# Patient Record
Sex: Male | Born: 1995 | Race: White | Hispanic: No | Marital: Single
Health system: Midwestern US, Academic
[De-identification: ages and names within clinical notes are randomized; demographics above are authoritative.]

---

## 2016-02-17 IMAGING — CT Abdomen^1_ABD_PELVIS_WO_W (Adult)
1 of 2 series · 13 of 32 positions shown, 19 images · IV contrast (APPLIED)
Comparison: none

[Series 2: abd/pel w/o 5.0 soft tissue · axial · non-contrast · 0.71mm/px · z∈[-421,-26]mm · 13 of 91 slices shown, 19 images]
[im 7/91  soft-tissue]
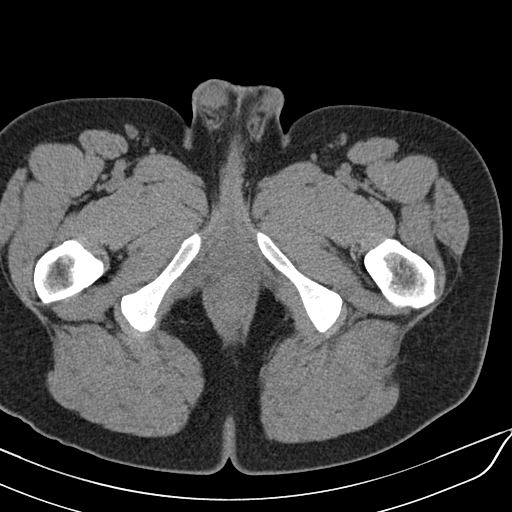
[im 7/91  bone]
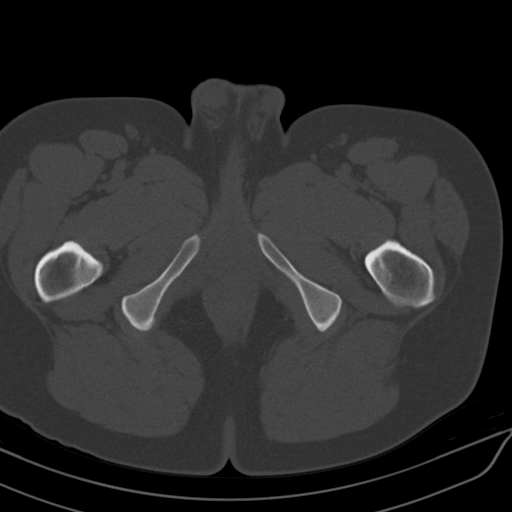
[im 13/91  soft-tissue]
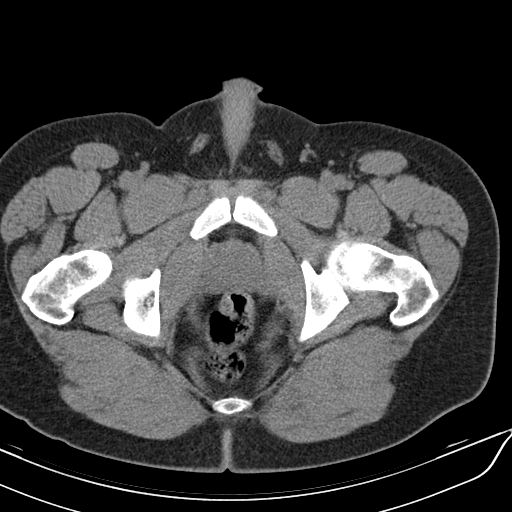
[im 19/91  soft-tissue]
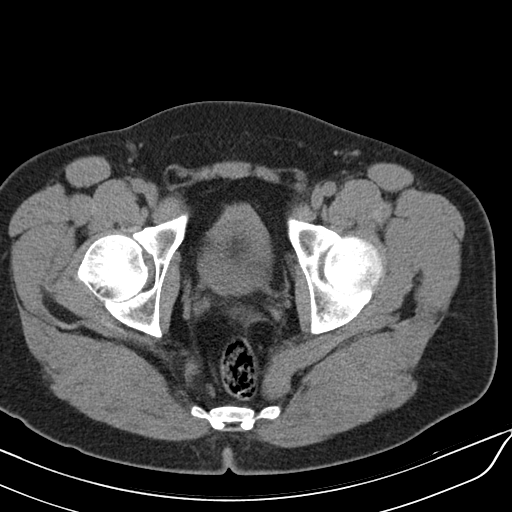
[im 25/91  soft-tissue]
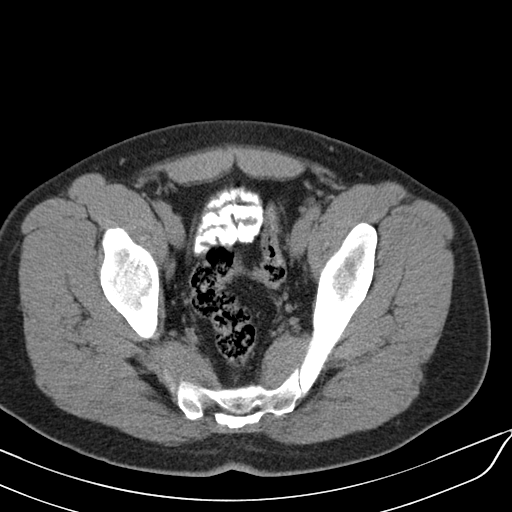
[im 31/91  soft-tissue]
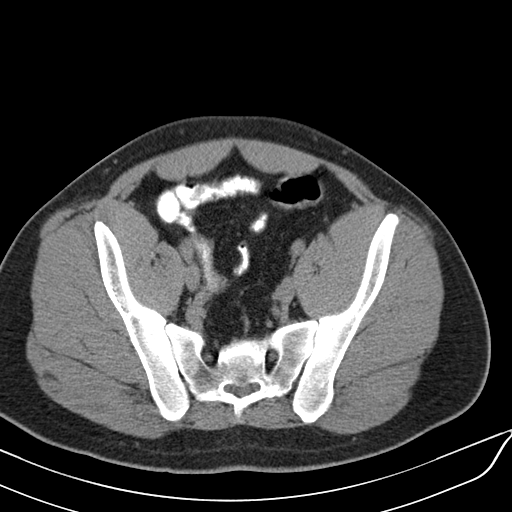
[im 37/91  soft-tissue]
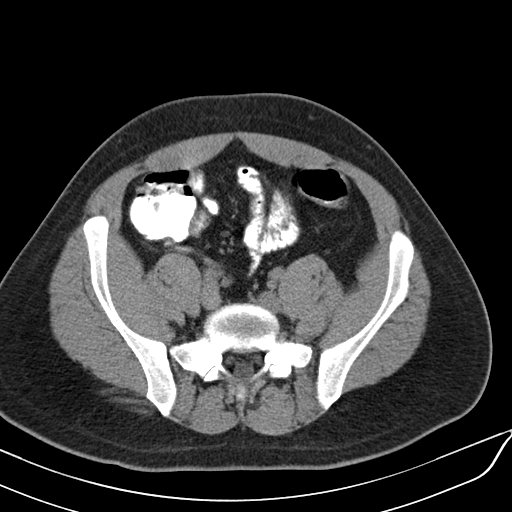
[im 49/91  soft-tissue]
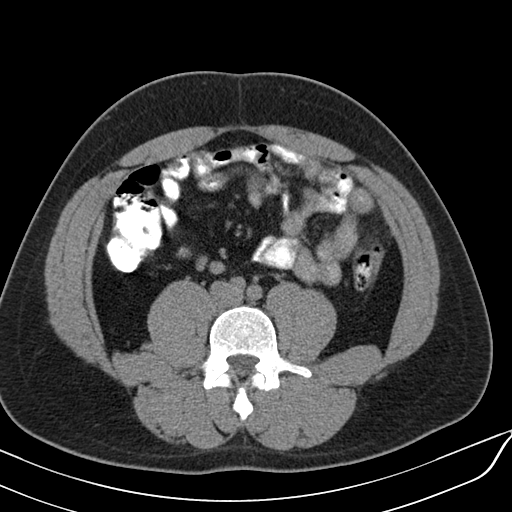
[im 55/91  soft-tissue]
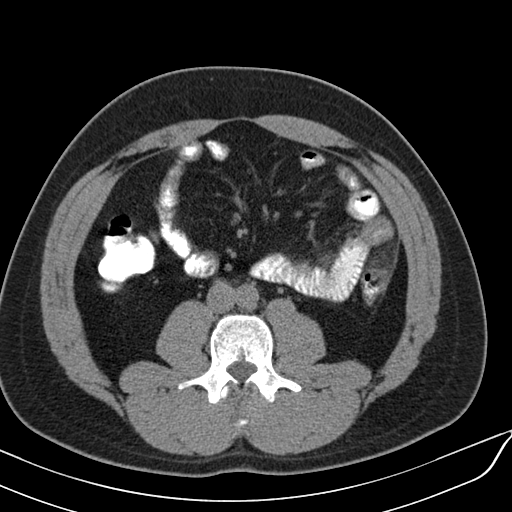
[im 61/91  soft-tissue]
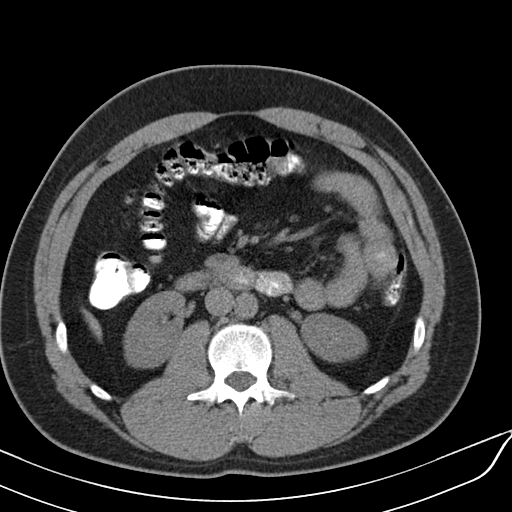
[im 61/91  bone]
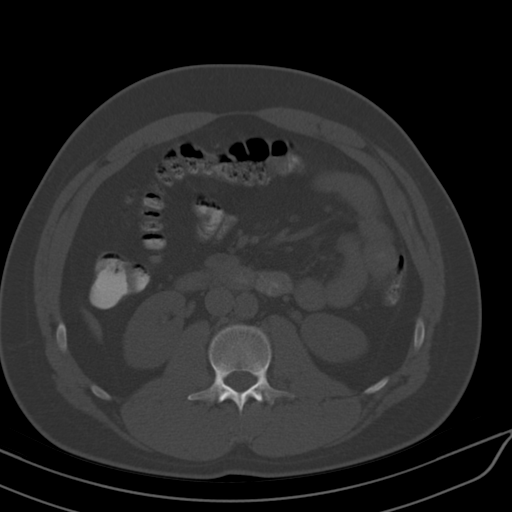
[im 67/91  soft-tissue]
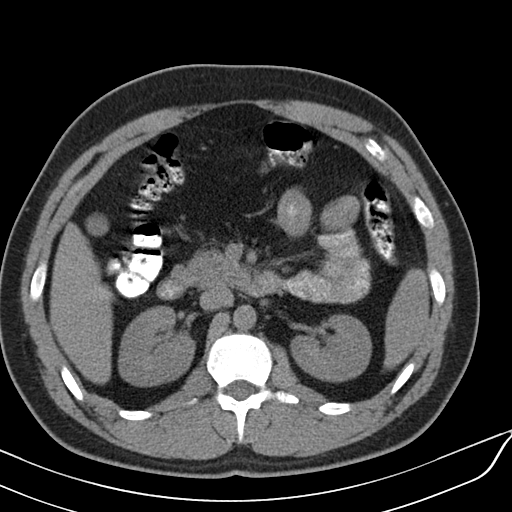
[im 67/91  lung]
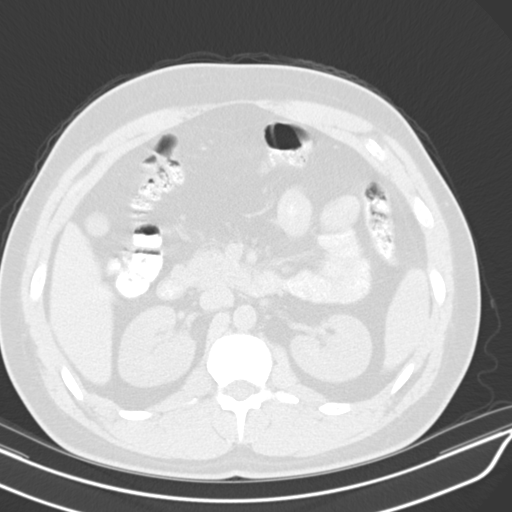
[im 73/91  soft-tissue]
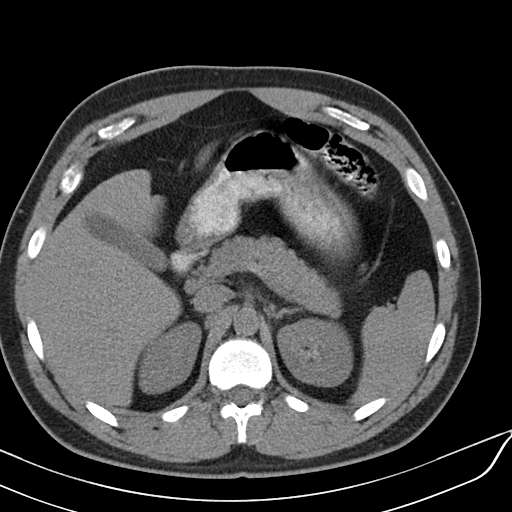
[im 73/91  lung]
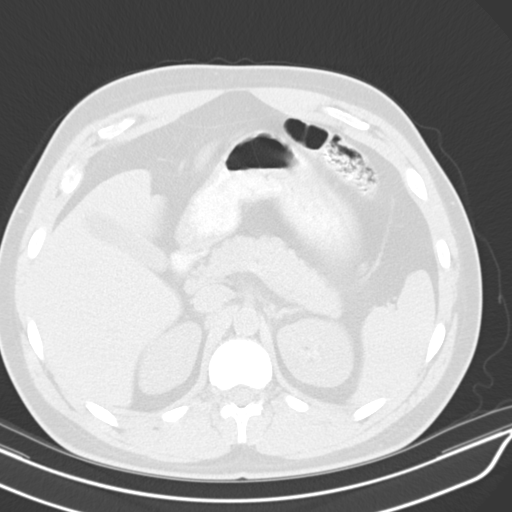
[im 79/91  soft-tissue]
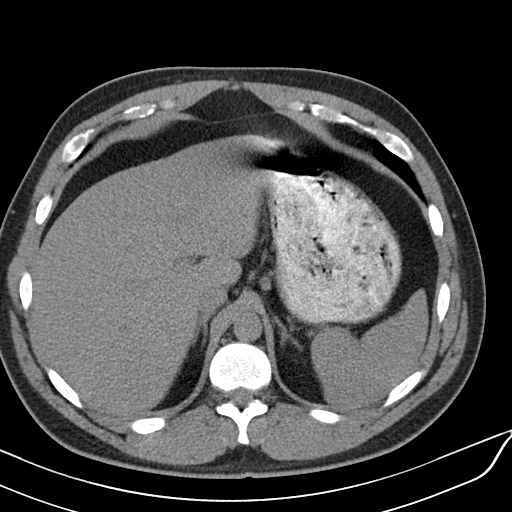
[im 79/91  lung]
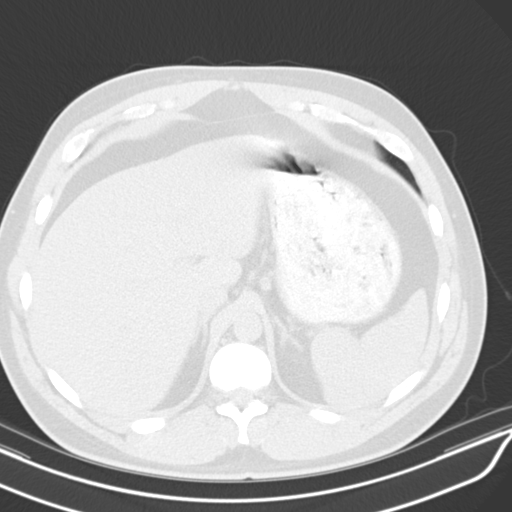
[im 85/91  soft-tissue]
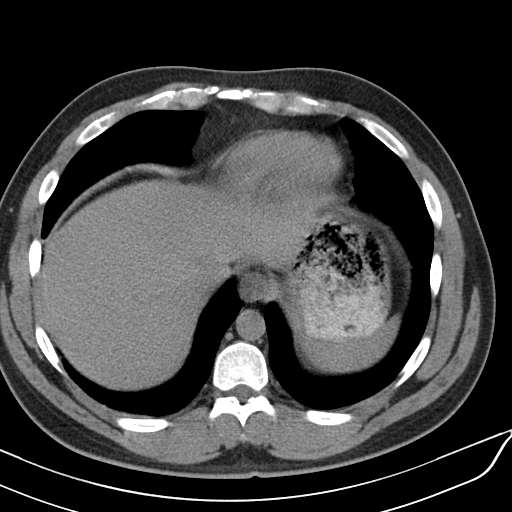
[im 85/91  lung]
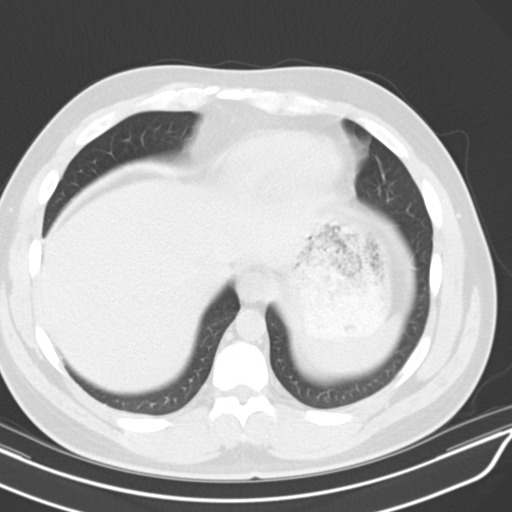

[13 of 32 positions shown; findings below may reference images not displayed]

DIAGNOSTIC STUDIES

EXAM
CT abdomen and pelvis with and without contrast

INDICATION
left abd pain and palpable mass
ABDOMINAL PAIN LEFT SIDE ABOVE ILIAC CREST X 3 DAYS. CHANGE IN BM'S.
ELEVATED WHITE COUNT. GIVEN 100 ML OMNIPAQUE 300 IV AND 50 ML OMNI ORALLY.
LM

TECHNIQUE
Volumetric multi detector CT images of the abdomen and pelvis were obtained before and after the
administration of IV contrast.

COMPARISONS
None available]

FINDINGS
The visualized lung bases are clear. There is focal segmental thickening and pericolonic
inflammatory changes of the mid descending colon. There is no evidence of hydronephrosis,
hydroureter or radiopaque calculus. There is no free air or free fluid. There is no mesenteric or
pelvic adenopathy. The appendix is normal in the right lower quadrant. The remaining hollow and
solid abdominal and pelvic viscera are grossly unremarkable. There is no acute osseous abnormality.
There is no significant degenerative change of the lumbosacral spine.

IMPRESSION
1. Focal thickening and pericolonic inflammatory changing of the mid descending colon likely
representing low-grade colitis versus diverticulitis.

## 2017-08-31 ENCOUNTER — Encounter: Admit: 2017-08-31 | Discharge: 2017-08-31 | Payer: Commercial Managed Care - PPO

## 2017-10-20 ENCOUNTER — Encounter: Admit: 2017-10-20 | Discharge: 2017-10-20 | Payer: Commercial Managed Care - PPO

## 2017-10-27 ENCOUNTER — Encounter: Admit: 2017-10-27 | Discharge: 2017-10-27 | Payer: Commercial Managed Care - PPO

## 2017-10-27 DIAGNOSIS — B360 Pityriasis versicolor: ICD-10-CM

## 2017-10-27 DIAGNOSIS — B029 Zoster without complications: ICD-10-CM

## 2017-10-27 DIAGNOSIS — R509 Fever, unspecified: Principal | ICD-10-CM

## 2017-10-28 ENCOUNTER — Ambulatory Visit: Admit: 2017-10-28 | Discharge: 2017-10-28 | Payer: Commercial Managed Care - PPO

## 2017-10-28 ENCOUNTER — Encounter: Admit: 2017-10-28 | Discharge: 2017-10-28 | Payer: Commercial Managed Care - PPO

## 2017-10-28 ENCOUNTER — Ambulatory Visit: Admit: 2017-10-28 | Discharge: 2017-10-29 | Payer: Commercial Managed Care - PPO

## 2017-10-28 ENCOUNTER — Ambulatory Visit: Admit: 2017-10-28 | Discharge: 2017-10-28 | Payer: No Typology Code available for payment source

## 2017-10-28 DIAGNOSIS — R509 Fever, unspecified: Principal | ICD-10-CM

## 2017-10-28 DIAGNOSIS — M533 Sacrococcygeal disorders, not elsewhere classified: ICD-10-CM

## 2017-10-28 DIAGNOSIS — R768 Other specified abnormal immunological findings in serum: Secondary | ICD-10-CM

## 2017-10-28 DIAGNOSIS — M5489 Other dorsalgia: Principal | ICD-10-CM

## 2017-10-28 DIAGNOSIS — M2550 Pain in unspecified joint: ICD-10-CM

## 2017-10-28 DIAGNOSIS — B029 Zoster without complications: ICD-10-CM

## 2017-10-28 DIAGNOSIS — B360 Pityriasis versicolor: ICD-10-CM

## 2017-10-28 DIAGNOSIS — R5382 Chronic fatigue, unspecified: ICD-10-CM

## 2017-10-28 LAB — HEPATITIS B SURFACE AB: Lab: >10 m[IU]/mL (ref >60)

## 2017-10-28 LAB — RHEUMATOID FACTOR (RF): Lab: <20 [IU]/mL (ref <24)

## 2017-10-28 LAB — PROTEIN/CR RATIO,UR RAN
Lab: 10 mg/dL — AB (ref 25–110)
Lab: 195 mg/dL (ref 7–40)

## 2017-10-28 LAB — TSH WITH FREE T4 REFLEX: Lab: 2.1 uU/mL — ABNORMAL LOW (ref 0.35–5.00)

## 2017-10-28 LAB — URIC ACID: Lab: 6.7 mg/dL (ref 4.0–8.0)

## 2017-10-28 LAB — COMPREHENSIVE METABOLIC PANEL
Lab: 139 MMOL/L (ref 137–147)
Lab: 3.9 MMOL/L (ref 3.5–5.1)
Lab: 83 mg/dL (ref 70–100)

## 2017-10-28 LAB — HEPATITIS C ANTIBODY W REFLEX HCV PCR QUANT: Lab: NEGATIVE 10*3/uL (ref 0–0.45)

## 2017-10-28 LAB — SED RATE: Lab: 3 mm/h (ref 0–15)

## 2017-10-28 LAB — URINALYSIS MICROSCOPIC REFLEX TO CULTURE

## 2017-10-28 LAB — URINALYSIS DIPSTICK REFLEX TO CULTURE: Lab: NEGATIVE mL/min (ref 0–0.80)

## 2017-10-28 LAB — IRON + BINDING CAPACITY + %SAT+ FERRITIN
Lab: 104 ng/mL (ref 30–300)
Lab: 462 ug/dL — ABNORMAL HIGH (ref 270–380)
Lab: 75 ug/dL (ref 50–185)

## 2017-10-28 LAB — CBC AND DIFF
Lab: 0 10*3/uL (ref 0–0.20)
Lab: 5.2 M/UL (ref 4.4–5.5)
Lab: 8.9 10*3/uL (ref 4.5–11.0)

## 2017-10-28 LAB — HEPATITIS B CORE AB TOT (IGG+IGM): Lab: NEGATIVE U/L (ref 7–56)

## 2017-10-28 LAB — CREATINE KINASE-CPK: Lab: 76 U/L (ref 35–232)

## 2017-10-28 LAB — C REACTIVE PROTEIN (CRP): Lab: 0.2 mg/dL (ref <1.0)

## 2017-10-28 LAB — HEPATITIS B SURFACE AG: Lab: NEGATIVE U/L (ref 7–40)

## 2017-10-28 NOTE — Progress Notes
Date of Service: 10/28/2017    Subjective:             Mark Perez is a 21 y.o. male referred by [facility] for elevated ANA    History of Present Illness    In 01/2016 he had chest pain, didn't feel right, went to ER in [place] and sent him home. Then he had a fever for 5 week and really high for a couple weeks. Saw ID here and did blood work and never really came up with anything. Was going to do scans but felt better by then.  The fevers were constant. They were worse right before bed. The highest it got was 102/103 in the evenings.     In 01/2017 he started running fever again, saw PCP and found a positive RNP. Saw rheumatologist Dr. Carney Bern and put him on diclofenac which helped significantly. Anytime he drank alcohol he hurt really bad. In 02/2017 he had really bad sore throat and fever, swollen glands, strep test came back positive and treated and symptoms resolved. This episode lasted three weeks. He had been put on prednisone which helped too. Joints hurt during these episodes.     He never returned to normal after the first episode. He has had ongoing fatigue, joint pain, not feeling good. Prior to 01/2016 he was healthy without issues, working hard and long hours without history of joint pain.     He has pain in his back and chest and rib cage, shoulders and neck. Every now and then his ankles are stiff. If he doesn't take the diclofenac it hurts bad and help tremendously.     His whole back hurts in entire spine. No radiation into buttocks. There are times his hips hurt and sits too long, the side of the hips hurt. It is overall better with movement and worse with inactivity and would probably get better with exercise. He feels stiff in the morning for about 30 minutes but takes diclofenac in the morning after waking up, but was much longer prior to starting diclofenac. Mostly in the back and some in the hips. Back pain would wake him up at night before starting diclofenac. Always having to stretch. Has neck pain and stiffness and in the muscles, never had it before this started. No radiculopathy.     Has pain in the knees when bending down too much, pop, no swelling. If he squats for long period his knees hurt in the front and side. No swelling, redness or warmth in the knees.     The ankles are stiff in the morning but not as bad as the back. No swelling, warmth or redness. This improved with diclofeanc too. Mostly on the side and front of the ankle, never had achilles tendon or heel pain. No plantar fasciitis that he thinks but not sure. Never had dactylitis.     Not much pain in the elbows, maybe a little, maybe if overused. He does do repetitive actions. No swelling in the elbows.     The shoulders hurt in the morning, feel stiff. Hard to raise them up. Better with movement. They were worse before diclofenac.     Notices some pain in the hands with weather changes and in the cold. Sort of stiff in the cold in the MCPs. If he uses his hands a lot they hurt. Never had stiffness in hadns in the morning prior to diclofenac. None in the PIPs or DIPs. Not noticed anything in the  wrists. No swelling in the hand joints. Never really that bad or daily prior to diclofenac.    He is still fatigued. Used to be very active and worked long hard hours prior to this starting. Never stopped, per mom. Sleep 8 hours and better now since starting diclofenac, but sometimes he feel unrefreshed. Mom thinks he snores but no apnea. No recent weight gain. No weight loss. Appetite is good even before diclofenac. Fatigue helped a little after starting diclofenac. Not falling asleep during day. Used to work past 5pm but not anymore.     No hair loss. No photosensitivity. No dry eyes or dry mouth. No parotid gland swelling. No inflammatory eye disease. No sores in mouth or nose, or genital area. No STD. No blood in urine, UTIs or GI infection.     Gets HA every once in a while, but not common. In 2016, before his first fever, he had stomach pain on the side with abnormal stools, no blood. Did a CT he had diverticulitis of ascending colon and put on antibiotics and got better. No more abdominal pain, diarrhea, blood in stool. Maybe has a hemorrhoid right now with blood on toilet paper one. No black stool. No GERD unless he drinks soda.     No CP, SOB, Cough. No palpitations, lightheadedness, dizzyness, or passing out. No rashes. No kidney stones, no DVT/PE/CVA/Seizure.    No raynaud's. No numbness or tingling.     Past medical history:  Hypertension     Past surgical history:  None     Family History:  HTN in father, brother, uncles  Father has had long standing pain, but never told of AS   None for IBD, psoriasis  Uncle and Paternal GF with lymphoma     Social history:  Drinks alcohol once a month   No tobacco use   No illicit drug use  He works on a farm, farm hand  Lives in [place]    Review of outside records:  03/10/17: HLA-B27 negative, CCP IgG/IgA 14 (0-19 units), anti-double-stranded DNA antibody 1IU per mL, anti-RNP 2.3 AI (0???0 0.9), Smith antibody less than 0.2 AI, anti-SCL 70 less than 0.2 AI, SSA antibody less than 0.2, SSB antibody less than 0.2, anti-Jo 1 less than 0.2, anticentromere less than 0.2    03/10/16: WBC 13.2, ALC 8700 high, AMC 1100 high, ESR 17 high, CRP 1.96 high, AST 99 high, a LT 185 high, alk phos 106 normal, albumin 3.4 low rest of CMP WNL, rheumatoid factor 49 high (<24), ANA negative, blood cultures x2 negative x 5 days, HIV negative  03/24/16: ALC 4900, rest of CBC with differential WNL, a LT 60 high, AST and alkaline phosphatase normal, albumin 4.5, bilirubin 0.7 normal    03/10/16:  Final Diagnosis:   Peripheral blood, flow cytometry:   Phenotypic study reveals an increase in CD8+ T cells, with a mildly   atypical phenotype.     Interpretation:   Lymphocytes comprise 69% of total events. ???The majority are T cells with a decreased CD4:CD8 ratio of 0.22, with an increase in CD8+ T cells, with a   mildly dim CD7 and CD5, and no expression of NK/LGL markers. The cells   express alpha/beta receptor. The B cells are few in number and are   polyclonal with normal antigen expression.     The significance of the CD8+ T cell population is uncertain, and these   cells may represent a reactive population, however a neoplastic process  cannot be entirely excluded. Recommend correlation with the morphologic   findings and clinical correlation. It may be appropriate to monitor the   patient for resolution of the findings. If there is a strong suspicion for   a neoplastic process, T cell gene rearrangement study could be considered.     Myeloblasts are not increased, and comprise 0.04% of total cells. ???     Rheumatology note [facility]. Geoffery Spruce [facility] 04/12/17:   Chronic joint pain.  He has history of 1 year and 3 months of low back, upper back and shoulder aches and pains.  He has had a couple episodes of febrile illness but fairly constant.  It makes me wonder about spondylitis, HLA-B27 is negative.  I believe ANA was negative.  Rheumatoid factor is negative.  Try Voltaren 75 mg twice a day and sort of an undifferentiated spondylitis-like approach.       Review of Systems  Constitutional: Positive for diaphoresis, fatigue and fever.   Cardiovascular:        HIGH B/P   Musculoskeletal: Positive for arthralgias and myalgias.   Neurological: Positive for weakness.   All other systems reviewed and are negative.      Objective:         ??? diclofenac sodium DR (VOLTAREN) 75 mg tablet Take 75 mg by mouth twice daily. Take with food.     Vitals:    10/28/17 0856   BP: 150/88   Pulse: 73   Resp: 18   Temp: 36.6 ???C (97.8 ???F)   TempSrc: [place]   SpO2: 99%   Weight: 96.6 kg (213 lb)   Height: 170.2 cm (67)     Body mass index is 33.36 kg/m???. Discussed patient's BMI with him.  The body mass index is 33.36 kg/m???. and falls within the category of Obesity 1 (30  to <35); specialist visit only, referred back to Primary Care Provider for follow up.      Physical Exam  Constitutional: A&Ox3, NAD  HEENT: at/nc, EOMI, sclera normal, MMM, no oral lesions, no parotid gland swelling or tenderness    Neck: no LAD   Cardiac: RRR, no murmurs noted   Pulm: CTAB, no wheeze, rales or rhonchi   Abd: soft, non tender, non distended, +BS  Neuro: No focal deficits, motor and sensory intact, MS 5/5 b/l UE and LE, b/l grip 5/5   Ext: no edema  Skin: no dermatitis, no sclerodactyly, no skin tightening or thickening, no telangectasia, no malar erythema, no digital pitting of ulcers, no pitting nails or psoriasis, no dactylitis    MSK:   Schober test 15 cm  Occiput to wall test normal  Tender to palpation in bilateral SI joints  TTP paraspinal lumbar, thoracic and cervical muscles  TTP cervical, thoracic and lumbar spine   Reduced range of motion in cervical spine actively  Pain in right shoulder with empty can test  FABER test negative   No synovitis        Assessment:  1. Inflammatory back pain - improved with NSAIDs  2. Previously Elevated Rheumatoid Factor   3. History of recurrent fever, leukocytosis and lymphocytosis in 2017  4. Abnormal flow cytometry 2017  5. Elevated inflammatory markers 2017  6. Hypertension  7. Fatigue- improved with NSAIDs, but not resolved  8.  Diverticulitis 2016, resolved after antibiotics    Mark Perez is a pleasant 21 year old Caucasian male referred for possible ankylosing spondylitis.  In 2017 he developed persistent  fevers, sore throat, swollen glands, joint pains, fatigue, found to have leukocytosis, elevated rheumatoid factor, elevated ESR and CRP without a clear etiology.  He responded to prednisone at that time.  He had another episode in 01/2017.  He has not had another episode since then.      However since his first episode in 2017, he has not been the same since (previously very healthy, worked long hours on the farm) and has been having daily persistent back pain which is inflammatory in nature and responded well to daily scheduled NSAIDs.  He also had fatigue that has responded to NSAIDs, but not completely resolved.    In 2016, before his first fever episode, he had stomach pain on the side with abnormal stools, no blood. Did a CT he had diverticulitis of ascending colon and put on antibiotics and got better. Report not available. He denies a history of STDs.     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  I would like to obtain a dedicated x-rays of the SI joints as well as the thoracic and lumbar spine given symptoms and these areas.  If SI joint x-rays are normal, we may need to consider obtaining an MRI with STIR technique to further evaluate for signs of active sacroiliitis that has not yet shown up on x-ray and possible non-radiographic axial spondylarthritis.  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      I would also like to obtain records from previous CT abdomen pelvis, there could be an underlying reactive arthritis with history of GI infection in 2016.     Plan:  1.  Repeat rheumatoid factor, if returns negative, previous rheumatoid factor could have been from transient infection  2.  Check CCP antibody.   3.  CBC with differential, CMP, ESR, CRP, UA, protein to creatinine ratio, uric acid, CPK, SPEP with immunofixation, iron panel, hepatitis B and C  4.  If iron panel shows iron deficiency, consider IBD panel and celiac panel  5.  SI joint x-rays to evaluate for sacroiliitis  6.  Thoracic and lumbar spine x-ray given symptoms in these areas and evaluation for any ankylosing spondylitis changes (i.e. Syndesmophytes)  7.  Pending x-ray results, consider MRI with STIR technique to evaluate for non-radiographic axial spondylarthritis  8.  Request previous CT abdomen pelvis report  9.  Continue diclofenac 75 mg twice a day given overall improvement in back symptoms    Once results return, we will arrange for follow-up accordingly    Costella Hatcher DO  Rheumatology Staff    Orders Placed This Encounter   ??? SI JOINTS MIN 3 VIEWS   ??? L SPINE COMPLETE   ??? T SPINE AP & LAT 2 VIEWS   ??? C REACTIVE PROTEIN (CRP) today   ??? CBC AND DIFF  today   ??? COMPREHENSIVE METABOLIC PANEL  today   ??? SED RATE today   ??? URINALYSIS DIPSTICK REFLEX TO CULTURE   ??? URINALYSIS MICROSCOPIC REFLEX TO CULTURE   ??? UA REFLEX CULTURE LABEL   ??? URINE-SPOT PROT/Cr RATIO today   ??? IMMUNOFIXATION, SERUM (IFES)   ??? CCP IGG ANTIBODY today   ??? RHEUMATOID FACTOR (RF)  today   ??? URIC ACID, today   ???  CREATINE KINASE-CPK [today   ??? ELECTROPHORESIS-SERUM PROTEIN today   ??? IRON + BINDING CAPACITY + %SAT + FERRITIN today   ??? TSH WITH FREE T4 REFLEX today   ??? HEPATITIS B SURFACE AG today   ??? HEPATITIS B CORE AB TOT (IGG+IGM) today   ??? HEPATITIS B SURFACE AB today   ??? HEPATITIS C AB today

## 2017-10-29 LAB — ELECTROPHORESIS-SERUM PROTEIN
Lab: 63 % (ref 48–68)
Lab: 7.9 g/dL (ref 6.0–8.0)
Lab: 8.5 % (ref 5–15)
Lab: 9.5 % (ref 9–17)

## 2017-10-29 LAB — IMMUNOFIXATION, SERUM (IFES)

## 2017-10-29 LAB — CCP IGG ANTIBODY

## 2017-11-04 ENCOUNTER — Encounter: Admit: 2017-11-04 | Discharge: 2017-11-04 | Payer: Commercial Managed Care - PPO

## 2017-11-04 DIAGNOSIS — R69 Illness, unspecified: Principal | ICD-10-CM

## 2017-11-06 ENCOUNTER — Encounter: Admit: 2017-11-06 | Discharge: 2017-11-06 | Payer: Commercial Managed Care - PPO

## 2017-11-06 DIAGNOSIS — K921 Melena: ICD-10-CM

## 2017-11-06 DIAGNOSIS — M47819 Spondylosis without myelopathy or radiculopathy, site unspecified: Principal | ICD-10-CM

## 2017-11-06 DIAGNOSIS — E611 Iron deficiency: ICD-10-CM

## 2017-11-20 ENCOUNTER — Ambulatory Visit: Admit: 2017-11-20 | Discharge: 2017-11-20 | Payer: Commercial Managed Care - PPO

## 2017-11-20 DIAGNOSIS — M47819 Spondylosis without myelopathy or radiculopathy, site unspecified: Principal | ICD-10-CM

## 2017-11-20 DIAGNOSIS — E611 Iron deficiency: ICD-10-CM

## 2017-11-20 DIAGNOSIS — K921 Melena: Secondary | ICD-10-CM

## 2017-11-22 LAB — CELIAC SCREEN: Lab: 100 U/mL — ABNORMAL HIGH (ref <15)

## 2017-11-25 LAB — INFLAMMATORY BOWEL DISEASE PANEL
Lab: 21 — ABNORMAL HIGH
Lab: <10

## 2017-11-26 ENCOUNTER — Encounter: Admit: 2017-11-26 | Discharge: 2017-11-26 | Payer: Commercial Managed Care - PPO

## 2017-11-26 NOTE — Telephone Encounter
-----  Message from Hildred Priest, DO sent at 11/06/2017  3:10 PM CST -----  Please call patient to inform of the following:    1.  The rheumatoid factor test that was elevated 1 year ago is now negative.  I suspect that this was elevated in the past due to an infection.  2.  Inflammation marker ESR and CRP are both normal.  These were both elevated one year ago when he was last seen with infectious disease.  3.  Iron studies show mildly low iron levels.  This is unusual for a young male, so I would like him to have a few additional labs done.  4.  All the other labs are negative and, or normal  5.  I recommend he continue taking diclofenac 75 mg twice a day  6.  Please help him schedule follow up with me 1/31 at 4pm. Please ask him to reschedule this appt if for some reason the MRI with STIR technique is not done prior to this appt.

## 2017-11-26 NOTE — Telephone Encounter
Called pt and lvm for a return call. Was going to offer pt appt on Monday 11/29/17. Will await pt to return call. Pt had MRI done on 11/20/17.

## 2017-11-26 NOTE — Telephone Encounter
-----   Message from Delano MetzPooja G Bhadbhade, DO sent at 11/06/2017  3:01 PM CST -----  Please call patient to inform of the following:    1.  X-ray of his thoracic and lumbar spine normal  2.  X-ray of his sacroiliac joints are normal  3.  Given his history and positive response to diclofenac tablets, I am still suspicious for seronegative spondyloarthropathy which can sometimes show normal x-rays early on in the disease, but can show abnormalities on MRI which is more sensitive to early changes, therefore I recommend an MRI with STIR technique of his sacroiliac joints to evaluate to help establish a diagnosis. I have ordered this, please let him know.    3.  Please see result note for labs

## 2017-11-29 NOTE — Telephone Encounter
Pt returned call and was notified of results and recommendations below. Pt stated that he labs and MRI completed and saw on Mychart that the labs came back abnormal. Pt stated he would like to come in for a follow up to discuss everything. Scheduling pt on 12/07/17 at 300pm with Dr. Fannie KneeBhadbhade. Pt aware of time date and location.    Routing to Dr. Fannie KneeBhadbhade as Lorain ChildesFYI. Pt stated he would wait to talk about results at appt. Pt stated he did try to be gluten free the last 4 to 5 days and has noticed a significant difference in how he feels. Pt stated it will be hard to go back on gluten with considering how he feels. Pt notified to wait to speak with Dr. Fannie KneeBhadbhade regarding the results and we may refer him to a GI specialist. Pt stated understanding and had no further questions at this time.

## 2017-12-07 ENCOUNTER — Ambulatory Visit: Admit: 2017-12-07 | Discharge: 2017-12-07 | Payer: Commercial Managed Care - PPO

## 2017-12-07 ENCOUNTER — Encounter: Admit: 2017-12-07 | Discharge: 2017-12-07 | Payer: Commercial Managed Care - PPO

## 2017-12-07 ENCOUNTER — Ambulatory Visit: Admit: 2017-12-07 | Discharge: 2017-12-07

## 2017-12-07 DIAGNOSIS — R5382 Chronic fatigue, unspecified: ICD-10-CM

## 2017-12-07 DIAGNOSIS — R509 Fever, unspecified: Principal | ICD-10-CM

## 2017-12-07 DIAGNOSIS — E611 Iron deficiency: ICD-10-CM

## 2017-12-07 DIAGNOSIS — M5489 Other dorsalgia: ICD-10-CM

## 2017-12-07 DIAGNOSIS — K900 Celiac disease: Principal | ICD-10-CM

## 2017-12-07 DIAGNOSIS — B029 Zoster without complications: ICD-10-CM

## 2017-12-07 DIAGNOSIS — B360 Pityriasis versicolor: ICD-10-CM

## 2017-12-07 DIAGNOSIS — K5900 Constipation, unspecified: ICD-10-CM

## 2017-12-08 LAB — 25-OH VITAMIN D (D2 + D3): Lab: 28 ng/mL — ABNORMAL LOW (ref 30–80)

## 2017-12-12 ENCOUNTER — Encounter: Admit: 2017-12-12 | Discharge: 2017-12-12 | Payer: Commercial Managed Care - PPO

## 2017-12-12 MED ORDER — ERGOCALCIFEROL (VITAMIN D2) 50,000 UNIT PO CAP
1 | ORAL_CAPSULE | ORAL | 0 refills | 56.00000 days | Status: AC
Start: 2017-12-12 — End: ?

## 2018-02-18 ENCOUNTER — Ambulatory Visit: Admit: 2018-02-18 | Discharge: 2018-02-18 | Payer: Commercial Managed Care - PPO

## 2018-02-18 ENCOUNTER — Encounter: Admit: 2018-02-18 | Discharge: 2018-02-18 | Payer: Commercial Managed Care - PPO

## 2018-02-18 DIAGNOSIS — B029 Zoster without complications: ICD-10-CM

## 2018-02-18 DIAGNOSIS — R509 Fever, unspecified: Principal | ICD-10-CM

## 2018-02-18 DIAGNOSIS — R140 Abdominal distension (gaseous): ICD-10-CM

## 2018-02-18 DIAGNOSIS — B360 Pityriasis versicolor: ICD-10-CM

## 2018-02-18 DIAGNOSIS — M549 Dorsalgia, unspecified: ICD-10-CM

## 2018-02-18 DIAGNOSIS — R109 Unspecified abdominal pain: Principal | ICD-10-CM

## 2018-02-18 DIAGNOSIS — I10 Essential (primary) hypertension: ICD-10-CM

## 2018-03-16 ENCOUNTER — Ambulatory Visit: Admit: 2018-03-16 | Discharge: 2018-03-16 | Payer: Commercial Managed Care - PPO

## 2018-03-16 ENCOUNTER — Encounter: Admit: 2018-03-16 | Discharge: 2018-03-16 | Payer: Commercial Managed Care - PPO

## 2018-03-16 DIAGNOSIS — R140 Abdominal distension (gaseous): ICD-10-CM

## 2018-03-16 DIAGNOSIS — R109 Unspecified abdominal pain: Principal | ICD-10-CM

## 2018-03-19 IMAGING — US DUPRENAL
1 series · 13 of 25 positions shown · non-contrast
Comparison: none

[Series 1: us renal artery · 13 of 79 slices shown]
[im 1/79]
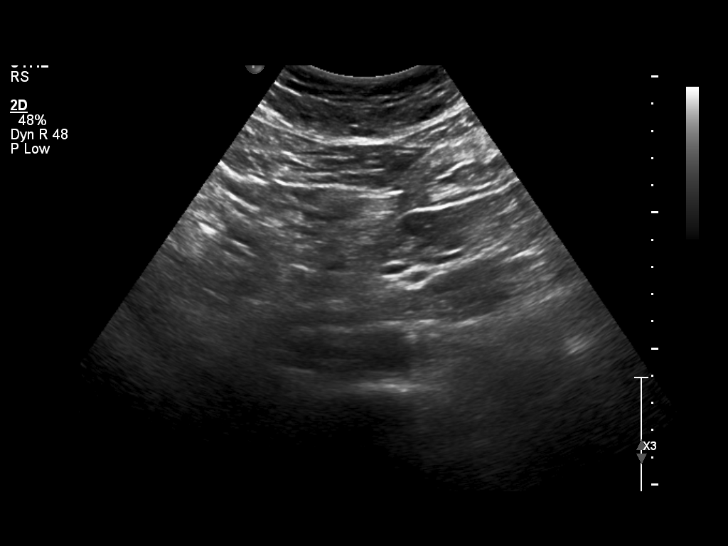
[im 7/79]
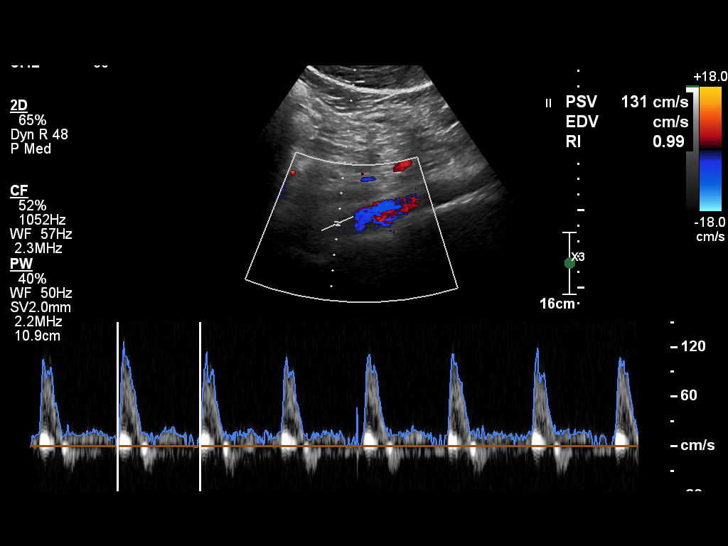
[im 14/79]
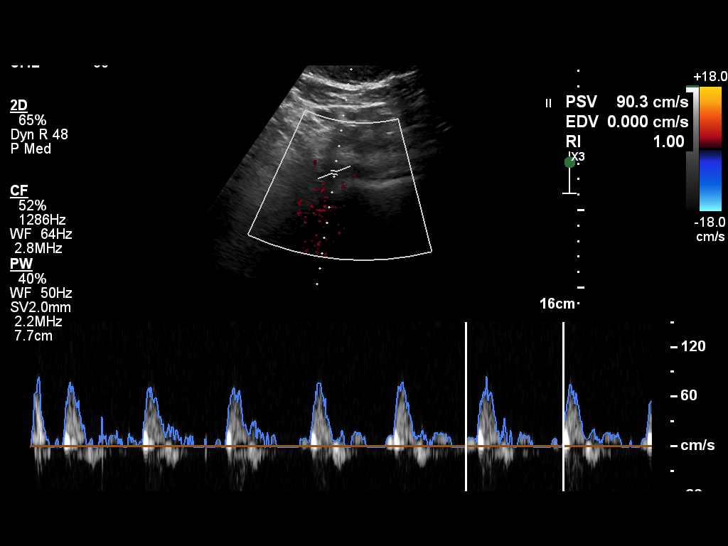
[im 20/79]
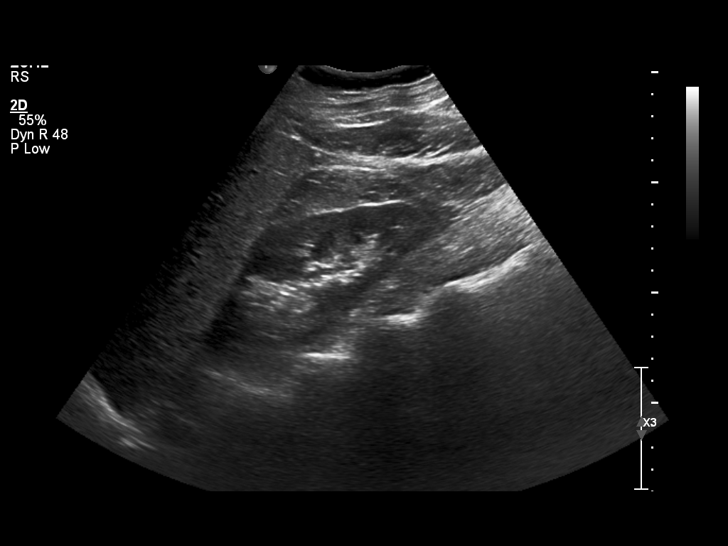
[im 27/79]
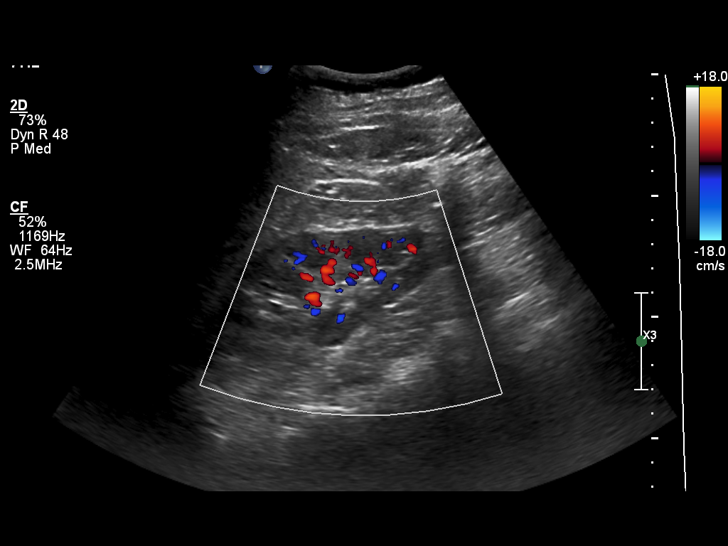
[im 33/79]
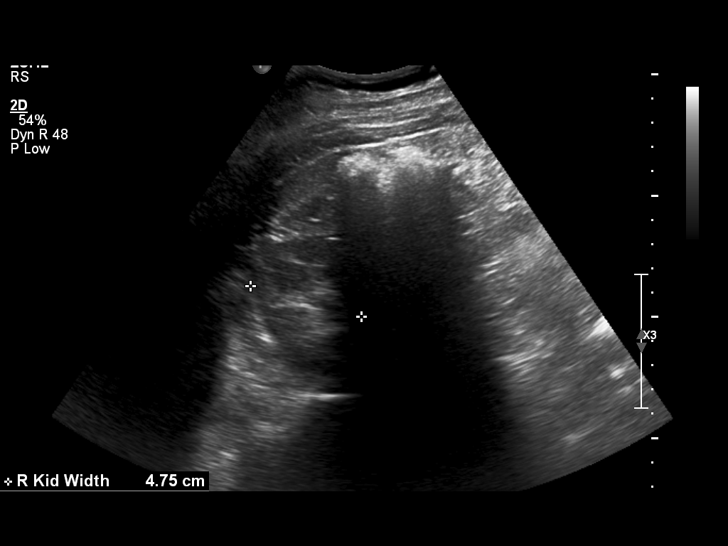
[im 40/79]
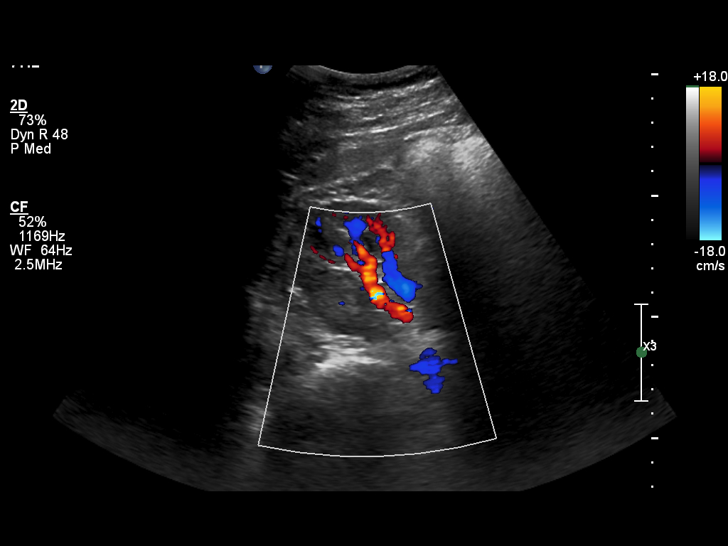
[im 46/79]
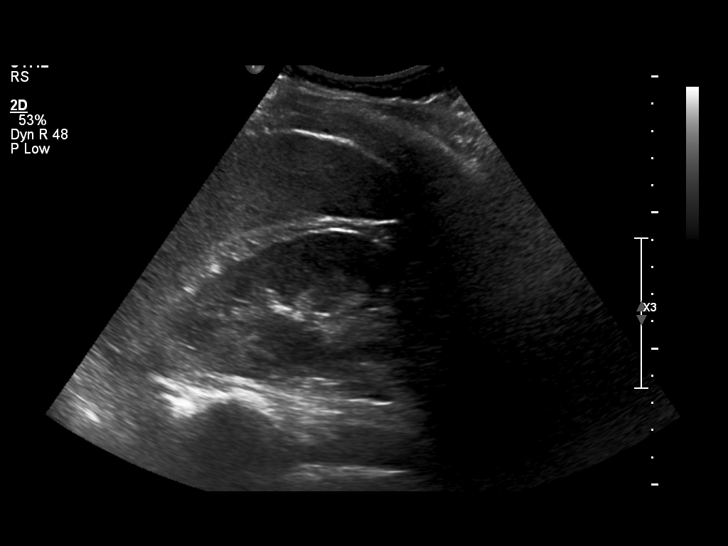
[im 53/79]
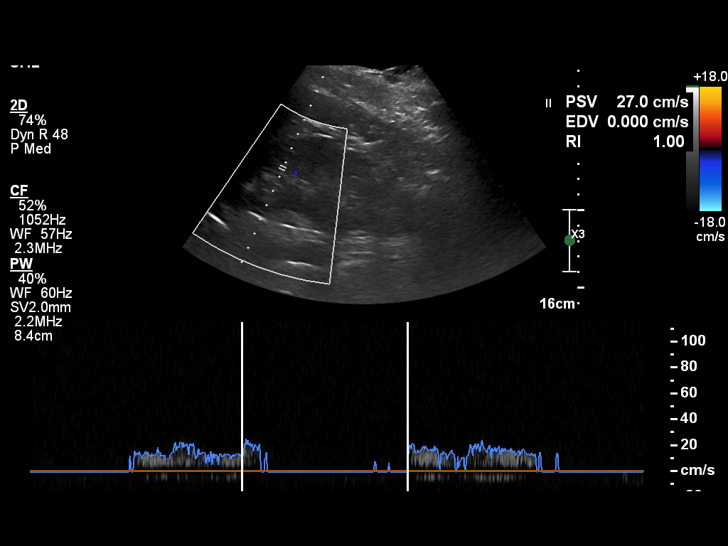
[im 59/79]
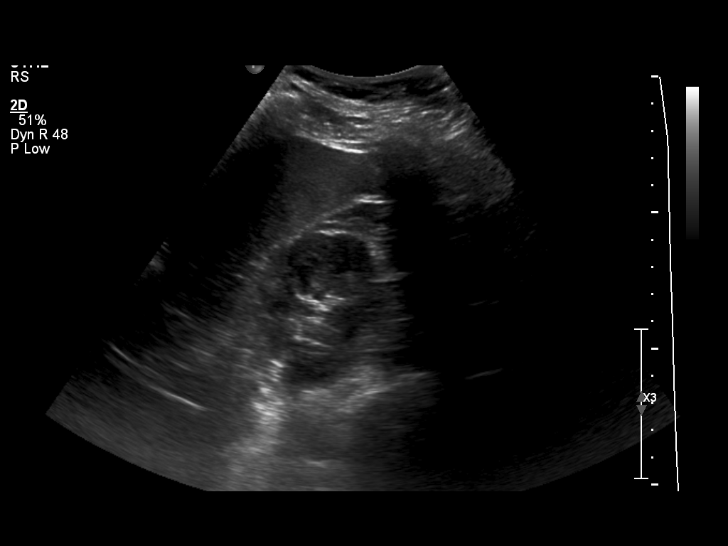
[im 66/79]
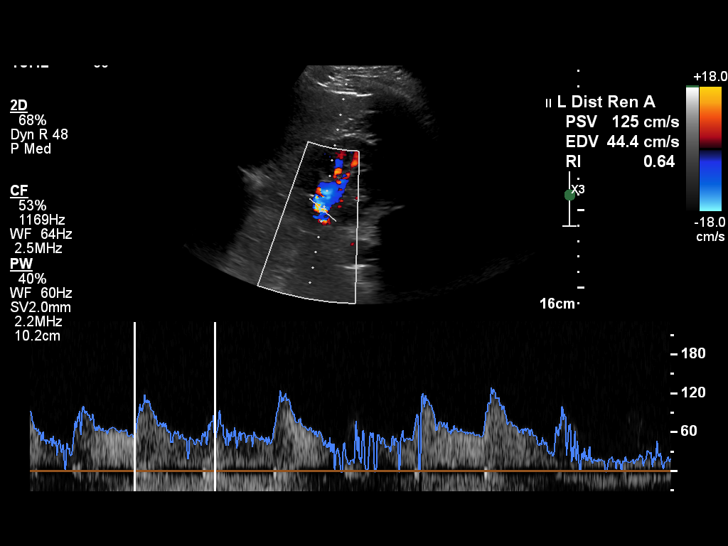
[im 72/79]
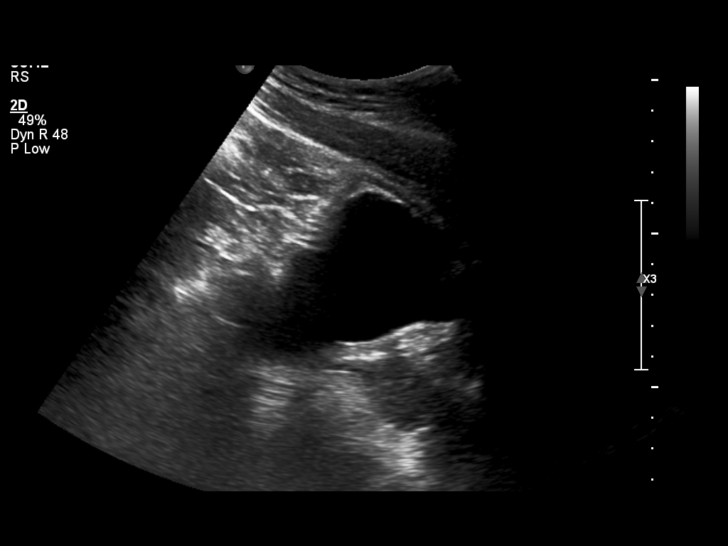
[im 79/79]
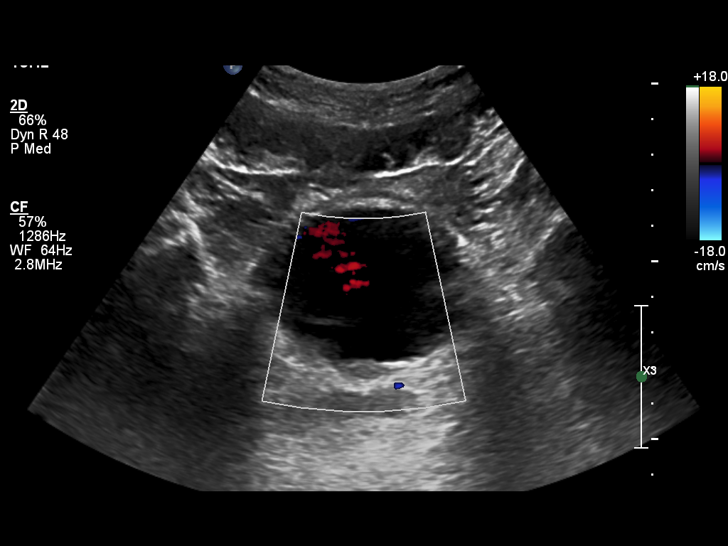

[13 of 25 positions shown; findings below may reference images not displayed]

ULTRASOUND REPORT

EXAM

ULTRASOUND, RETROPERITONEAL, REAL TIME WITH IMAGE DOCUMENTATION; COMPLETE CPT 07001

INDICATION

TECHNIQUE

Multiple static grayscale and color Doppler ultrasound images are provided from a bilateral renal
ultrasound.

COMPARISONS

There are no previous examinations available for comparison at the time of dictation.

FINDINGS

The aorta is normal in caliber and contour. It has a peak systolic velocity of 118 cm/sec. The
waveform appears within normal limits. There is no evidence of significant atherosclerotic
calcified plaque.

The right kidney measures 11.8 x 5.6 x 4.8 centimeters. There are no solid or cystic masses
identified on the right kidney. There is no evidence of hydronephrosis or obstructive uropathy. The
right renal vein is patent. The right renal artery has a peak systolic velocity of 115 cm/sec at
its origin, 209 cm/sec along its midportion and 125 cm/sec at the level of the right renal hilum.

The left kidney measures 10 x 5.6 x 4.4 centimeters. There are no solid or cystic masses
identified on the left kidney. There is no evidence of hydronephrosis or obstructive uropathy. The
left renal vein is patent. The left renal artery has a peak systolic velocity of 110 cm/sec at its
origin, 177 cm/sec along its midportion and 125 cm/sec at the level of the left renal hilum.

IMPRESSION

Normal renal ultrasound. Based on this examination there is no evidence of hemodynamically
significant stenosis on either renal artery.

## 2018-03-21 ENCOUNTER — Encounter: Admit: 2018-03-21 | Discharge: 2018-03-21 | Payer: Commercial Managed Care - PPO

## 2018-06-07 ENCOUNTER — Encounter: Admit: 2018-06-07 | Discharge: 2018-06-07 | Payer: Commercial Managed Care - PPO

## 2018-06-17 ENCOUNTER — Encounter: Admit: 2018-06-17 | Discharge: 2018-06-17 | Payer: Commercial Managed Care - PPO

## 2018-07-15 ENCOUNTER — Encounter: Admit: 2018-07-15 | Discharge: 2018-07-15 | Payer: Commercial Managed Care - PPO

## 2018-07-15 ENCOUNTER — Ambulatory Visit: Admit: 2018-07-15 | Discharge: 2018-07-15 | Payer: Commercial Managed Care - PPO

## 2018-07-15 DIAGNOSIS — R509 Fever, unspecified: Principal | ICD-10-CM

## 2018-07-15 DIAGNOSIS — K900 Celiac disease: Principal | ICD-10-CM

## 2018-07-15 DIAGNOSIS — B029 Zoster without complications: ICD-10-CM

## 2018-07-15 DIAGNOSIS — I10 Essential (primary) hypertension: ICD-10-CM

## 2018-07-15 DIAGNOSIS — M549 Dorsalgia, unspecified: ICD-10-CM

## 2018-07-15 DIAGNOSIS — B360 Pityriasis versicolor: ICD-10-CM

## 2018-07-15 LAB — CBC
Lab: 222 K/UL (ref 150–400)
Lab: 34 g/dL (ref 32.0–36.0)
Lab: 5 M/UL (ref 4.4–5.5)
Lab: 8.6 K/UL (ref 4.5–11.0)

## 2018-07-15 LAB — IRON + BINDING CAPACITY + %SAT+ FERRITIN
Lab: 22 % — ABNORMAL LOW (ref 28–42)
Lab: 423 ug/dL — ABNORMAL HIGH (ref 270–380)
Lab: 92 ug/dL (ref 50–185)

## 2018-07-15 LAB — COMPREHENSIVE METABOLIC PANEL
Lab: 102 MMOL/L (ref 98–110)
Lab: 135 MMOL/L — ABNORMAL LOW (ref 137–147)
Lab: 21 U/L (ref 7–56)
Lab: 4 MMOL/L (ref 3.5–5.1)

## 2018-07-15 LAB — FOLATE, SERUM: Lab: 9.2 ng/mL (ref >3.9)

## 2018-07-15 LAB — VITAMIN B12: Lab: 436 pg/mL (ref 180–914)

## 2018-07-16 LAB — ZINC: Lab: 0.6 — ABNORMAL LOW

## 2018-07-16 LAB — COPPER: Lab: 0.9

## 2018-07-16 LAB — 25-OH VITAMIN D (D2 + D3): Lab: 28 ng/mL — ABNORMAL LOW (ref 30–80)

## 2018-07-18 LAB — CELIAC SCREEN: Lab: 12 U/mL (ref <15)

## 2018-07-19 LAB — ENDOMYSIAL AB, IGA W/REFLEX: Lab: NEGATIVE

## 2018-07-20 LAB — CELIAC ASSOC HLA TYPING, BLOOD

## 2018-07-21 ENCOUNTER — Encounter: Admit: 2018-07-21 | Discharge: 2018-07-21 | Payer: Commercial Managed Care - PPO

## 2018-07-22 MED ORDER — CHOLECALCIFEROL (VITAMIN D3) 1,000 UNIT (25 MCG) PO TAB
1000 [IU] | ORAL_TABLET | Freq: Every day | ORAL | 0 refills | 84.00000 days | Status: AC
Start: 2018-07-22 — End: ?

## 2018-07-22 MED ORDER — ZINC 50 MG PO TAB
50 mg | ORAL_TABLET | Freq: Every day | ORAL | 0 refills | Status: AC
Start: 2018-07-22 — End: ?

## 2018-07-22 MED ORDER — FERROUS SULFATE 325 MG (65 MG IRON) PO TAB
325 mg | ORAL_TABLET | Freq: Every day | ORAL | 0 refills | Status: AC
Start: 2018-07-22 — End: ?

## 2018-10-31 ENCOUNTER — Encounter: Admit: 2018-10-31 | Discharge: 2018-10-31 | Payer: Commercial Managed Care - PPO

## 2018-11-01 ENCOUNTER — Encounter: Admit: 2018-11-01 | Discharge: 2018-11-01 | Payer: Commercial Managed Care - PPO

## 2018-11-02 ENCOUNTER — Encounter: Admit: 2018-11-02 | Discharge: 2018-11-02 | Payer: Commercial Managed Care - PPO

## 2018-11-18 ENCOUNTER — Encounter: Admit: 2018-11-18 | Discharge: 2018-11-18 | Payer: Commercial Managed Care - PPO

## 2018-11-24 ENCOUNTER — Encounter: Admit: 2018-11-24 | Discharge: 2018-11-24 | Payer: Commercial Managed Care - PPO

## 2018-11-25 ENCOUNTER — Encounter: Admit: 2018-11-25 | Discharge: 2018-11-25 | Payer: Commercial Managed Care - PPO

## 2018-11-25 DIAGNOSIS — K900 Celiac disease: Principal | ICD-10-CM

## 2018-12-09 ENCOUNTER — Encounter: Admit: 2018-12-09 | Discharge: 2018-12-09 | Payer: Commercial Managed Care - PPO
